# Patient Record
Sex: Male | Born: 1990 | Race: White | Hispanic: No | Marital: Single | Smoking: Never smoker
Health system: Southern US, Community
[De-identification: ages and names within clinical notes are randomized; demographics above are authoritative.]

---

## 2015-12-22 ENCOUNTER — Emergency Department (HOSPITAL_COMMUNITY)
Admission: EM | Admit: 2015-12-22 | Discharge: 2015-12-22 | Disposition: A | Discharge status: Home / Self Care | Payer: Medicaid Other | Attending: Emergency Medicine | Admitting: Emergency Medicine

## 2015-12-22 ENCOUNTER — Emergency Department (HOSPITAL_COMMUNITY): Payer: Medicaid Other

## 2015-12-22 ENCOUNTER — Encounter (HOSPITAL_COMMUNITY): Payer: Self-pay | Admitting: Emergency Medicine

## 2015-12-22 DIAGNOSIS — S62667B Nondisplaced fracture of distal phalanx of left little finger, initial encounter for open fracture: Secondary | ICD-10-CM | POA: Diagnosis not present

## 2015-12-22 DIAGNOSIS — Y92007 Garden or yard of unspecified non-institutional (private) residence as the place of occurrence of the external cause: Secondary | ICD-10-CM | POA: Insufficient documentation

## 2015-12-22 DIAGNOSIS — Y9389 Activity, other specified: Secondary | ICD-10-CM | POA: Diagnosis not present

## 2015-12-22 DIAGNOSIS — Y999 Unspecified external cause status: Secondary | ICD-10-CM | POA: Diagnosis not present

## 2015-12-22 DIAGNOSIS — W230XXA Caught, crushed, jammed, or pinched between moving objects, initial encounter: Secondary | ICD-10-CM | POA: Insufficient documentation

## 2015-12-22 DIAGNOSIS — Z23 Encounter for immunization: Secondary | ICD-10-CM | POA: Insufficient documentation

## 2015-12-22 DIAGNOSIS — S6992XA Unspecified injury of left wrist, hand and finger(s), initial encounter: Secondary | ICD-10-CM | POA: Diagnosis present

## 2015-12-22 MED ORDER — TETANUS-DIPHTH-ACELL PERTUSSIS 5-2.5-18.5 LF-MCG/0.5 IM SUSP
0.5000 mL | Freq: Once | INTRAMUSCULAR | Status: AC
  Administered 2015-12-22: 0.5 mL via INTRAMUSCULAR
  Filled 2015-12-22: qty 0.5

## 2015-12-22 MED ORDER — CEPHALEXIN 500 MG PO CAPS: 500.0000 mg | ORAL_CAPSULE | Freq: Four times a day (QID) | ORAL | 0 refills | Status: DC

## 2015-12-22 MED ORDER — CEFAZOLIN IN D5W 1 GM/50ML IV SOLN
1.0000 g | Freq: Once | INTRAVENOUS | Status: AC
  Administered 2015-12-22: 1 g via INTRAVENOUS
  Filled 2015-12-22 (×2): qty 50

## 2015-12-22 MED ORDER — ACETAMINOPHEN 325 MG PO TABS: 650.0000 mg | ORAL_TABLET | Freq: Four times a day (QID) | ORAL | 0 refills | Status: AC | PRN

## 2015-12-22 MED ORDER — OXYCODONE HCL 5 MG PO TABS: 5.0000 mg | ORAL_TABLET | ORAL | 0 refills | Status: AC | PRN

## 2015-12-22 MED ORDER — LIDOCAINE HCL (PF) 1 % IJ SOLN
30.0000 mL | Freq: Once | INTRAMUSCULAR | Status: AC
  Administered 2015-12-22: 30 mL
  Filled 2015-12-22: qty 30

## 2015-12-22 MED ORDER — IBUPROFEN 200 MG PO TABS: 600.0000 mg | ORAL_TABLET | Freq: Four times a day (QID) | ORAL | 0 refills | Status: AC | PRN

## 2015-12-22 NOTE — ED Notes (Signed)
Patient was alert, oriented and stable upon discharge. RN went over AVS and patient had no further questions.  

## 2015-12-22 NOTE — ED Notes (Signed)
Patient transported to X-ray 

## 2015-12-22 NOTE — ED Provider Notes (Signed)
[facility]-EMERGENCY DEPT Provider Note   CSN: [id number] Arrival date & time: 12/22/15  1930  By signing my name below, I, Teofilo Pod, attest that this documentation has been prepared under the direction and in the presence of Buel Ream, PA-C. Electronically Signed: Teofilo Pod, ED Scribe. 12/22/2015. 8:24 PM.   History   Chief Complaint Chief Complaint  Patient presents with  . Extremity Laceration    The history is provided by the patient. No language interpreter was used.   HPI Comments:  Hunter [place] is a 25 y.o. male who presents to the Emergency Department, here due to a laceration to his left small finger that he sustained 4 hours ago. Pt states that he was doing yard work and smashed his left small finger on the tow hook of his truck. Pt notes that he is unable to fully move the joint of the injured finger. Pt has used ice for the past 2 hours which has provided temporary relief. Pt denies any pain or injury/trauma elsewhere. Pt denies other associated symptoms.   History reviewed. No pertinent past medical history.  There are no active problems to display for this patient.   History reviewed. No pertinent surgical history.     Home Medications    Prior to Admission medications   Medication Sig Start Date End Date Taking? Authorizing Provider  acetaminophen (TYLENOL) 325 MG tablet Take 2 tablets (650 mg total) by mouth every 6 (six) hours as needed for mild pain or moderate pain. 12/22/15   Mack Hook, MD  cephALEXin (KEFLEX) 500 MG capsule Take 1 capsule (500 mg total) by mouth 4 (four) times daily. 12/22/15   Mack Hook, MD  ibuprofen (MOTRIN IB) 200 MG tablet Take 3 tablets (600 mg total) by mouth every 6 (six) hours as needed for mild pain or moderate pain. 12/22/15   Mack Hook, MD  oxyCODONE (ROXICODONE) 5 MG immediate release tablet Take 1 tablet (5 mg total) by mouth every 4 (four) hours as needed for severe pain or breakthrough  pain. 12/22/15   Mack Hook, MD    Family History No family history on file.  Social History Social History  Substance Use Topics  . Smoking status: Never Smoker  . Smokeless tobacco: Never Used  . Alcohol use No     Allergies   Benadryl [diphenhydramine hcl (sleep)] and Scallops [shellfish allergy]   Review of Systems Review of Systems  Constitutional: Negative for chills and fever.  HENT: Negative for facial swelling and sore throat.   Respiratory: Negative for shortness of breath.   Cardiovascular: Negative for chest pain.  Gastrointestinal: Negative for abdominal pain, nausea and vomiting.  Musculoskeletal: Positive for arthralgias. Negative for back pain.  Skin: Positive for wound. Negative for rash.  Psychiatric/Behavioral: The patient is not nervous/anxious.      Physical Exam Updated Vital Signs BP 133/84   Pulse 81   Temp 99.1 F (37.3 C)   Resp 16   Ht 5\' 10"  (1.778 m)   Wt (!) 141.6 kg   SpO2 98%   BMI 44.80 kg/m   Physical Exam  Constitutional: He appears well-developed and well-nourished. No distress.  HENT:  Head: Normocephalic and atraumatic.  Mouth/Throat: Oropharynx is clear and moist. No oropharyngeal exudate.  Eyes: Conjunctivae are normal. Pupils are equal, round, and reactive to light. Right eye exhibits no discharge. Left eye exhibits no discharge. No scleral icterus.  Neck: Normal range of motion. Neck supple. No thyromegaly present.  Cardiovascular: Normal rate,  regular rhythm, normal heart sounds and intact distal pulses.  Exam reveals no gallop and no friction rub.   No murmur heard. Pulmonary/Chest: Effort normal and breath sounds normal. No stridor. No respiratory distress. He has no wheezes. He has no rales.  Abdominal: Soft. Bowel sounds are normal. He exhibits no distension. There is no tenderness. There is no rebound and no guarding.  Musculoskeletal: He exhibits no edema.       Left hand: He exhibits tenderness, bony  tenderness and laceration. He exhibits normal capillary refill. Normal sensation noted. Normal strength noted.  L hand: Complete nailbed laceration to the left fifth digit; cap refill <2 secs; sensation intact; full range of motion at DIP and PIP; 5/5 strength; abduction and adduction intact; see image for more details  Lymphadenopathy:    He has no cervical adenopathy.  Neurological: He is alert. Coordination normal.  Skin: Skin is warm and dry. No rash noted. He is not diaphoretic. No pallor.  Psychiatric: He has a normal mood and affect.  Nursing note and vitals reviewed.          ED Treatments / Results  DIAGNOSTIC STUDIES:  Oxygen Saturation is 98% on RA, normal by my interpretation.    COORDINATION OF CARE:  8:24 PM Discussed treatment plan with pt at bedside and pt agreed to plan.   Labs (all labs ordered are listed, but only abnormal results are displayed) Labs Reviewed - No data to display  EKG  EKG Interpretation None       Radiology Dg Finger Little Left  Result Date: 12/22/2015 CLINICAL DATA:  Closed finger in door.  Laceration EXAM: LEFT LITTLE FINGER 2+V COMPARISON:  None. FINDINGS: Avulsion fracture the tuft of the distal fifth phalanx. Mild displacement. Associated soft tissue swelling and laceration. IMPRESSION: Fracture of the distal tuft of the fifth finger. Electronically Signed   By: Marlan Palauharles  Clark M.D.   On: 12/22/2015 21:01    Procedures Procedures (including critical care time)  Medications Ordered in ED Medications  Tdap (BOOSTRIX) injection 0.5 mL (0.5 mLs Intramuscular Given 12/22/15 2008)  ceFAZolin (ANCEF) IVPB 1 g/50 mL premix (0 g Intravenous Stopped 12/22/15 2211)  lidocaine (PF) (XYLOCAINE) 1 % injection 30 mL (30 mLs Infiltration Given by Other 12/22/15 2133)     Initial Impression / Assessment and Plan / ED Course  I have reviewed the triage vital signs and the nursing notes.  Pertinent labs & imaging results that were  available during my care of the patient were reviewed by me and considered in my medical decision making (see chart for details).  Clinical Course    Patient with complete nail bed laceration with fracture of the distal tuft of the fifth finger. I consulted Dr. Janee Mornhompson, hand surgeon, who evaluated the patient and repaired his injury in the ED. See Dr. Carollee Massedhompson's note for more details. Dr. Janee Mornhompson sent him home with Keflex, and analgesic. He'll arrange follow-up at his office. Patient understands and agrees with plan. Patient vitals stable throughout ED course and discharged in satisfactory condition.  Final Clinical Impressions(s) / ED Diagnoses   Final diagnoses:  Nondisplaced fracture of distal phalanx of left little finger, initial encounter for open fracture    New Prescriptions Discharge Medication List as of 12/22/2015 10:04 PM    START taking these medications   Details  acetaminophen (TYLENOL) 325 MG tablet Take 2 tablets (650 mg total) by mouth every 6 (six) hours as needed for mild pain or moderate pain., Starting Tue  12/22/2015, OTC    cephALEXin (KEFLEX) 500 MG capsule Take 1 capsule (500 mg total) by mouth 4 (four) times daily., Starting Tue 12/22/2015, Print    ibuprofen (MOTRIN IB) 200 MG tablet Take 3 tablets (600 mg total) by mouth every 6 (six) hours as needed for mild pain or moderate pain., Starting Tue 12/22/2015, OTC    oxyCODONE (ROXICODONE) 5 MG immediate release tablet Take 1 tablet (5 mg total) by mouth every 4 (four) hours as needed for severe pain or breakthrough pain., Starting Tue 12/22/2015, Print      I personally performed the services described in this documentation, which was scribed in my presence. The recorded information has been reviewed and is accurate.     Emi Holes, PA-C 12/23/15 1208    Maia Plan, MD 12/23/15 [phone]

## 2015-12-22 NOTE — ED Triage Notes (Signed)
Pt states that he hit his L little finger in a tailgate tonight and has a laceration across his nailbed. Alert and oriented.

## 2015-12-22 NOTE — Consult Note (Signed)
ORTHOPAEDIC CONSULTATION HISTORY & PHYSICAL REQUESTING PHYSICIAN: Hunter PlanJoshua G Long, MD  Chief Complaint: Left little finger injury  HPI: Hunter [place] is a 25 y.o. male who incompletely amputated the tip of the left small finger, loading a truck.  [place], so [facility] [place] [facility].  He has already received a tetanus update today, and IV antibiotics.  Injury happened around 4 PM.  History reviewed. No pertinent past medical history. History reviewed. No pertinent surgical history. Social History   Social History  . Marital status: Single    Spouse name: N/A  . Number of children: N/A  . Years of education: N/A   Social History Main Topics  . Smoking status: Never Smoker  . Smokeless tobacco: Never Used  . Alcohol use No  . Drug use: No  . Sexual activity: Not Asked   Other Topics Concern  . None   Social History Narrative  . None   No family history on file. Allergies  Allergen Reactions  . Benadryl [Diphenhydramine Hcl (Sleep)]     Hyper   . Scallops [Shellfish Allergy]    Prior to Admission medications   Medication Sig Start Date End Date Taking? Authorizing Provider  acetaminophen (TYLENOL) 325 MG tablet Take 2 tablets (650 mg total) by mouth every 6 (six) hours as needed for mild pain or moderate pain. 12/22/15   Mack Hookavid Heyward Douthit, MD  cephALEXin (KEFLEX) 500 MG capsule Take 1 capsule (500 mg total) by mouth 4 (four) times daily. 12/22/15   Mack Hookavid Davone Shinault, MD  ibuprofen (MOTRIN IB) 200 MG tablet Take 3 tablets (600 mg total) by mouth every 6 (six) hours as needed for mild pain or moderate pain. 12/22/15   Mack Hookavid Cashe Gatt, MD  oxyCODONE (ROXICODONE) 5 MG immediate release tablet Take 1 tablet (5 mg total) by mouth every 4 (four) hours as needed for severe pain or breakthrough pain. 12/22/15   Mack Hookavid Aune Adami, MD   Dg Finger Little Left  Result Date: 12/22/2015 CLINICAL DATA:   Closed finger in door.  Laceration EXAM: LEFT LITTLE FINGER 2+V COMPARISON:  None. FINDINGS: Avulsion fracture the tuft of the distal fifth phalanx. Mild displacement. Associated soft tissue swelling and laceration. IMPRESSION: Fracture of the distal tuft of the fifth finger. Electronically Signed   By: Marlan Palauharles  Clark M.D.   On: 12/22/2015 21:01    Positive ROS: All other systems have been reviewed and were otherwise negative with the exception of those mentioned in the HPI and as above.  Physical Exam: Vitals: Refer to EMR. Constitutional:  WD, WN, NAD HEENT:  NCAT, EOMI Neuro/Psych:  Alert & oriented to person, place, and time; appropriate mood & affect Lymphatic: No generalized extremity edema or lymphadenopathy Extremities / MSK:  The extremities are normal with respect to appearance, ranges of motion, joint stability, muscle strength/tone, sensation, & perfusion except as otherwise noted:  Left small finger incompletely amputated, with laceration through the nail plate 2 mm distal to the distal edge of the eponychial fold.  Laceration wraps around the ulnar side of the soft tissues towards the pulp, leaving a volar and radial intact bridge.  Flexion extensor tendons intact.  The flap appears viable.  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  Digital block was performed by me.  Turnicot was applied to the base of the digit and the wound copiously irrigated and clean under running water.  He was then prepped with Betadine and repair commenced, first the nail plate was removed.  It was into pieces, and both were removed.  Then, using a 21-gauge needle to reapproximate and pin the fracture fragments to the remainder of the phalanx.  With this in place, 5-0 chromic suture was used to reapproximate the  soft tissues ulnarly as well as perform a nailbed repair.  The needle was divided closer to the hub, and then such that it rested dorsally over the nail bed.  Xeroform was placed into the eponychial fold and on the nailbed under the pin.  Splint dressing was applied.  He tolerated this well. He'll be discharged with oral antibiotics, and analgesic planned, and my office will contact him within the next 1-2 days to arrange follow-up early next week.  He was instructed to keep the bandage on, clean and dry, until he returns.  Hunter Asters Janee Morn, MD      Orthopaedic & Hand Surgery [facility] [address] [place], [place]  [address] Office: [phone] Mobile: [phone]  12/22/2015, 9:54 PM

## 2015-12-22 NOTE — Discharge Instructions (Addendum)
Discharge Instructions ° ° °You have a dressing with a splint incorporated in it. °Move your fingers as much as possible, making a full fist and fully opening the fist. °Elevate your hand to reduce pain & swelling of the digits.  Ice over the operative site may be helpful to reduce pain & swelling.  DO NOT USE HEAT. °Pain medicine has been prescribed for you.  °Leave the dressing in place until you return to our office.  °You may shower, but keep the bandage clean & dry.  °You may drive a car when you are off of prescription pain medications and can safely control your vehicle with both hands. °Our office will call you to arrange follow-up ° ° °Please call [phone] during normal business hours or [phone] after hours for any problems. Including the following: ° °- excessive redness of the incisions °- drainage for more than 4 days °- fever of more than 101.5 F ° °*Please note that pain medications will not be refilled after hours or on weekends. ° ° ° ° °

## 2017-04-16 ENCOUNTER — Emergency Department (HOSPITAL_COMMUNITY)
Admission: EM | Admit: 2017-04-16 | Discharge: 2017-04-16 | Disposition: A | Discharge status: Home / Self Care | Payer: Medicaid Other | Attending: Emergency Medicine | Admitting: Emergency Medicine

## 2017-04-16 ENCOUNTER — Other Ambulatory Visit: Payer: Self-pay

## 2017-04-16 ENCOUNTER — Emergency Department (HOSPITAL_COMMUNITY): Payer: Medicaid Other

## 2017-04-16 ENCOUNTER — Encounter (HOSPITAL_COMMUNITY): Payer: Self-pay | Admitting: Emergency Medicine

## 2017-04-16 DIAGNOSIS — Y939 Activity, unspecified: Secondary | ICD-10-CM | POA: Diagnosis not present

## 2017-04-16 DIAGNOSIS — Y929 Unspecified place or not applicable: Secondary | ICD-10-CM | POA: Diagnosis not present

## 2017-04-16 DIAGNOSIS — Y999 Unspecified external cause status: Secondary | ICD-10-CM | POA: Insufficient documentation

## 2017-04-16 DIAGNOSIS — W01198A Fall on same level from slipping, tripping and stumbling with subsequent striking against other object, initial encounter: Secondary | ICD-10-CM | POA: Insufficient documentation

## 2017-04-16 DIAGNOSIS — S81811A Laceration without foreign body, right lower leg, initial encounter: Secondary | ICD-10-CM | POA: Diagnosis not present

## 2017-04-16 DIAGNOSIS — F840 Autistic disorder: Secondary | ICD-10-CM | POA: Insufficient documentation

## 2017-04-16 DIAGNOSIS — S8991XA Unspecified injury of right lower leg, initial encounter: Secondary | ICD-10-CM | POA: Diagnosis present

## 2017-04-16 MED ORDER — SULFAMETHOXAZOLE-TRIMETHOPRIM 800-160 MG PO TABS: 1.0000 | ORAL_TABLET | Freq: Two times a day (BID) | ORAL | 0 refills | Status: AC

## 2017-04-16 MED ORDER — CEPHALEXIN 500 MG PO CAPS: 500.0000 mg | ORAL_CAPSULE | Freq: Four times a day (QID) | ORAL | 0 refills | Status: AC

## 2017-04-16 MED ORDER — SULFAMETHOXAZOLE-TRIMETHOPRIM 800-160 MG PO TABS
1.0000 | ORAL_TABLET | Freq: Once | ORAL | Status: AC
  Administered 2017-04-16: 1 via ORAL
  Filled 2017-04-16: qty 1

## 2017-04-16 MED ORDER — CEPHALEXIN 500 MG PO CAPS
500.0000 mg | ORAL_CAPSULE | Freq: Once | ORAL | Status: AC
  Administered 2017-04-16: 500 mg via ORAL
  Filled 2017-04-16: qty 1

## 2017-04-16 MED ORDER — IBUPROFEN 200 MG PO TABS
600.0000 mg | ORAL_TABLET | Freq: Once | ORAL | Status: AC
  Administered 2017-04-16: 600 mg via ORAL
  Filled 2017-04-16: qty 3

## 2017-04-16 NOTE — ED Triage Notes (Addendum)
Pt reports fishing this morning and fell on a rock. Result is a an open laceration on anterior RLE. Unwrapped and visualized, pain is 10/10 and bleeding is moderately controlled. Rewrapped to control bleeding with telfa, sterile gauze and wrapped.

## 2017-04-16 NOTE — Discharge Instructions (Signed)
You may remove the bandage after 24 hours. Clean the wound and surrounding area gently with tap water and mild soap. Rinse well and blot dry. Do not scrub the wound, as this may cause the wound edges to come apart. You may shower, but avoid submerging the wound, such as with a bath or swimming. Clean the wound daily to prevent infection. Do not use cleaners such as hydrogen peroxide or alcohol.   Please take all of your antibiotics until finished!   You may develop abdominal discomfort or diarrhea from the antibiotic.  You may help offset this with probiotics which you can buy or get in yogurt. Do not eat or take the probiotics until 2 hours after your antibiotic.   Scar reduction: Application of a topical antibiotic ointment, such as Neosporin, after the wound has begun to close and heal well can decrease scab formation and reduce scarring. After the wound has healed and wound closures have been removed, application of ointments such as Aquaphor can also reduce scar formation.  The key to scar reduction is keeping the skin well hydrated and supple. Drinking plenty of water throughout the day (At least eight 8oz glasses of water a day) is essential to staying well hydrated.  Sun exposure: Keep the wound out of the sun. After the wound has healed, continue to protect it from the sun by wearing protective clothing or applying sunscreen.  Pain: You may use Tylenol, naproxen, or ibuprofen for pain.  Suture/staple removal: Return to the ED in 8-12 days for suture removal.  Wound check: Return to the ED or go to a primary care provider in about 3 days for wound check.  Return to the ED sooner should the wound edges come apart or signs of infection arise, such as spreading redness, puffiness/swelling, pus draining from the wound, severe increase in pain, fever over 100.46F, or any other major issues.

## 2017-04-16 NOTE — ED Provider Notes (Signed)
[facility]-EMERGENCY DEPT Provider Note   CSN: [id number] Arrival date & time: 04/16/17  1653     History   Chief Complaint Chief Complaint  Patient presents with  . Laceration    HPI Hunter Thomas is a 27 y.o. male.  HPI   Hunter [place] is a 27 y.o. male, with a history of mild cognitive dysfunction and autism, presenting to the ED with laceration to the right lower leg that occurred around 1 PM today.  Accompanied by his mother at the bedside.  Patient [place], slipped, and struck the front of his leg on some rocks.  The wound did not enter the water and he washed the wound in the shower prior to arrival.  Pain is throbbing, severe, nonradiating.  Has not taken any medications prior to arrival.  Tetanus up-to-date.  Denies numbness, weakness, head injury, neck/back pain, or any other complaints.     History reviewed. No pertinent past medical history.  There are no active problems to display for this patient.   History reviewed. No pertinent surgical history.     Home Medications    Prior to Admission medications   Medication Sig Start Date End Date Taking? Authorizing Provider  acetaminophen (TYLENOL) 325 MG tablet Take 2 tablets (650 mg total) by mouth every 6 (six) hours as needed for mild pain or moderate pain. 12/22/15   Mack Hookhompson, David, MD  cephALEXin (KEFLEX) 500 MG capsule Take 1 capsule (500 mg total) by mouth 4 (four) times daily. 04/16/17   Rayce Brahmbhatt C, PA-C  ibuprofen (MOTRIN IB) 200 MG tablet Take 3 tablets (600 mg total) by mouth every 6 (six) hours as needed for mild pain or moderate pain. 12/22/15   Mack Hookhompson, David, MD  oxyCODONE (ROXICODONE) 5 MG immediate release tablet Take 1 tablet (5 mg total) by mouth every 4 (four) hours as needed for severe pain or breakthrough pain. 12/22/15   Mack Hookhompson, David, MD  sulfamethoxazole-trimethoprim (BACTRIM DS,SEPTRA DS) 800-160 MG tablet Take 1 tablet by  mouth 2 (two) times daily for 10 days. 04/16/17 04/26/17  Anselm PancoastJoy, Finis Hendricksen C, PA-C    Family History History reviewed. No pertinent family history.  Social History Social History   Tobacco Use  . Smoking status: Never Smoker  . Smokeless tobacco: Never Used  Substance Use Topics  . Alcohol use: No  . Drug use: No     Allergies   Benadryl [diphenhydramine hcl (sleep)] and Scallops [shellfish allergy]   Review of Systems Review of Systems  Musculoskeletal: Negative for back pain and neck pain.  Skin: Positive for wound.  Neurological: Negative for weakness and numbness.     Physical Exam Updated Vital Signs BP (!) 150/81   Pulse (!) 125   Temp 98.2 F (36.8 C)   Resp 20   SpO2 100%   Physical Exam  Constitutional: He appears well-developed and well-nourished. No distress.  HENT:  Head: Normocephalic and atraumatic.  Eyes: Conjunctivae are normal.  Neck: Neck supple.  Cardiovascular: Normal rate, regular rhythm and intact distal pulses.  Despite the patient's listed initial pulse rate, he was not tachycardic upon my exam.  Pulmonary/Chest: Effort normal.  Musculoskeletal: He exhibits tenderness.  3 cm curvilinear laceration to the right anterior lower leg.  Hemorrhage largely controlled.  Surrounding tenderness, but no noted crepitus, instability, or deformity. Full range of motion in the right knee and ankle.  Neurological: He is alert.  No noted acute sensory deficits.  Strength 5/5 with flexion and extension at the bilateral hips, knees, and ankles. No noted gait deficit. Coordination intact with heel to shin testing.  Skin: Skin is warm and dry. Capillary refill takes less than 2 seconds. He is not diaphoretic. No pallor.  Psychiatric: He has a normal mood and affect. His behavior is normal.  Nursing note and vitals reviewed.            ED Treatments / Results  Labs (all labs ordered are listed, but only abnormal results are displayed) Labs Reviewed -  No data to display  EKG  EKG Interpretation None       Radiology Dg Tibia/fibula Right  Result Date: 04/16/2017 CLINICAL DATA:  Larey Seat on large sharp rock today around 1400hrs. Pain and laceration to mid shaft of right lower leg. No trouble weight bearing EXAM: RIGHT TIBIA AND FIBULA - 2 VIEW COMPARISON:  None. FINDINGS: There is no evidence of fracture or other focal bone lesions. Soft tissues are unremarkable. IMPRESSION: Negative. Electronically Signed   By: Norva Pavlov M.D.   On: 04/16/2017 18:52    Procedures .Marland KitchenLaceration Repair Date/Time: 04/16/2017 6:04 PM Performed by: Anselm Pancoast, PA-C Authorized by: Anselm Pancoast, PA-C   Consent:    Consent obtained:  Verbal   Consent given by:  Patient   Risks discussed:  Infection, need for additional repair, pain, poor cosmetic result, poor wound healing and retained foreign body Anesthesia (see MAR for exact dosages):    Anesthesia method:  Local infiltration   Local anesthetic:  Lidocaine 2% WITH epi and bupivacaine 0.5% w/o epi Laceration details:    Location:  Leg   Leg location:  R lower leg   Length (cm):  3 Repair type:    Repair type:  Intermediate Pre-procedure details:    Preparation:  Patient was prepped and draped in usual sterile fashion and imaging obtained to evaluate for foreign bodies Exploration:    Hemostasis achieved with:  Epinephrine   Wound exploration: wound explored through full range of motion   Treatment:    Area cleansed with:  Betadine and saline   Amount of cleaning:  Extensive   Irrigation solution:  Sterile saline   Irrigation volume:  600cc   Irrigation method:  Syringe Subcutaneous repair:    Suture size:  4-0   Suture material:  Vicryl   Suture technique:  Figure eight   Number of sutures:  4 Skin repair:    Repair method:  Sutures   Suture size:  3-0   Suture material:  Prolene   Suture technique:  Horizontal mattress   Number of sutures:  4 Approximation:    Approximation:   Close Post-procedure details:    Dressing:  Non-adherent dressing and sterile dressing   Patient tolerance of procedure:  Tolerated well, no immediate complications     (including critical care time)  Medications Ordered in ED Medications  ibuprofen (ADVIL,MOTRIN) tablet 600 mg (600 mg Oral Given 04/16/17 1950)  cephALEXin (KEFLEX) capsule 500 mg (500 mg Oral Given 04/16/17 1951)  sulfamethoxazole-trimethoprim (BACTRIM DS,SEPTRA DS) 800-160 MG per tablet 1 tablet (1 tablet Oral Given 04/16/17 1950)     Initial Impression / Assessment and Plan / ED Course  I have reviewed the triage vital signs and the nursing notes.  Pertinent labs & imaging results that were available during my care of the patient were reviewed by me and considered in my medical decision making (see chart for details).     Patient  presents with a laceration to the right lower leg.  No noted retained foreign bodies or underlying osseous injury on x-ray.  No noted neuro deficits.  Laceration repaired without immediate complication.  Antibiotic coverage extended to include MRSA coverage as well as coverage due to possible [place] environmental exposure. PCP follow up for wound check in 2 days (mother states patient has access to a PCP). The patient was given instructions for home care as well as return precautions. Patient voices understanding of these instructions, accepts the plan, and is comfortable with discharge.     Final Clinical Impressions(s) / ED Diagnoses   Final diagnoses:  Laceration of right lower extremity, initial encounter    ED Discharge Orders        Ordered    cephALEXin (KEFLEX) 500 MG capsule  4 times daily     04/16/17 1954    sulfamethoxazole-trimethoprim (BACTRIM DS,SEPTRA DS) 800-160 MG tablet  2 times daily     04/16/17 1954       Concepcion Living 04/16/17 2325    Mancel Bale, MD 04/16/17 [phone]

## 2017-04-20 ENCOUNTER — Emergency Department (HOSPITAL_COMMUNITY)
Admission: EM | Admit: 2017-04-20 | Discharge: 2017-04-20 | Disposition: A | Discharge status: Home / Self Care | Payer: Medicaid Other | Attending: Emergency Medicine | Admitting: Emergency Medicine

## 2017-04-20 ENCOUNTER — Encounter (HOSPITAL_COMMUNITY): Payer: Self-pay | Admitting: Emergency Medicine

## 2017-04-20 DIAGNOSIS — Z5189 Encounter for other specified aftercare: Secondary | ICD-10-CM

## 2017-04-20 DIAGNOSIS — S81811D Laceration without foreign body, right lower leg, subsequent encounter: Secondary | ICD-10-CM | POA: Diagnosis present

## 2017-04-20 DIAGNOSIS — W01198A Fall on same level from slipping, tripping and stumbling with subsequent striking against other object, initial encounter: Secondary | ICD-10-CM | POA: Insufficient documentation

## 2017-04-20 NOTE — ED Triage Notes (Signed)
Patient reports he is here for check up for recent stitches on his right shin. Pt denies any pain or signs of infection.

## 2017-04-20 NOTE — Discharge Instructions (Signed)
Continue taking the antibiotics you were prescribed at your last visit. Keep wound clean with mild soap and water. Keep area covered with a topical antibiotic ointment and bandage and keep bandage dry. Ice and elevate for additional pain and swelling relief. Alternate between Ibuprofen and Tylenol for additional pain relief. Follow up with your primary care doctor or [facility] in approximately 5-7 days for wound recheck and suture removal. Monitor area for signs of infection to include, but not limited to: increasing pain, spreading redness, drainage/pus, worsening swelling, or fevers. Return to emergency department for emergent changing or worsening symptoms.

## 2017-04-20 NOTE — ED Provider Notes (Signed)
[facility]-EMERGENCY DEPT Provider Note   CSN: [id number] Arrival date & time: 04/20/17  1649     History   Chief Complaint Chief Complaint  Patient presents with  . Wound Check    HPI Hunter Thomas is a 27 y.o. male with a PMHx of autism, who presents to the ED for wound check.  Per chart review, pt sustained a RLE laceration on 04/16/17 after slipping on rocks and cutting his leg on the rocks.  The wound was closed using 4-0 prolene utilizing 4 simple interrupted sutures on the skin layer (some vicryl sutures used for subcutaneous repair) and he was discharged home on keflex and bactrim and advised to return in 2 days for wound recheck.  Patient states that he has been feeling well and the wound has been doing well since the stitches were placed on 04/16/17.  He denies any drainage, erythema, red streaking, warmth, swelling, or ongoing pain.  He denies any fevers or any other complaints at this time.  He has been compliant with the antibiotics that were prescribed to him.  He has no complaints or concerns at this time.  His TDap is UTD.  Xray on the date of initial visit was negative.    The history is provided by the patient and medical records. No language interpreter was used.  Wound Check  This is a new problem. The current episode started more than 2 days ago. The problem occurs rarely. The problem has been rapidly improving. Nothing aggravates the symptoms. Nothing relieves the symptoms. Treatments tried: antibiotics. The treatment provided significant relief.    History reviewed. No pertinent past medical history.  There are no active problems to display for this patient.   History reviewed. No pertinent surgical history.     Home Medications    Prior to Admission medications   Medication Sig Start Date End Date Taking? Authorizing Provider  acetaminophen (TYLENOL) 325 MG tablet Take 2 tablets (650 mg total) by mouth every 6 (six) hours as needed for  mild pain or moderate pain. 12/22/15   Mack Hook, MD  cephALEXin (KEFLEX) 500 MG capsule Take 1 capsule (500 mg total) by mouth 4 (four) times daily. 04/16/17   Joy, Shawn C, PA-C  ibuprofen (MOTRIN IB) 200 MG tablet Take 3 tablets (600 mg total) by mouth every 6 (six) hours as needed for mild pain or moderate pain. 12/22/15   Mack Hook, MD  oxyCODONE (ROXICODONE) 5 MG immediate release tablet Take 1 tablet (5 mg total) by mouth every 4 (four) hours as needed for severe pain or breakthrough pain. 12/22/15   Mack Hook, MD  sulfamethoxazole-trimethoprim (BACTRIM DS,SEPTRA DS) 800-160 MG tablet Take 1 tablet by mouth 2 (two) times daily for 10 days. 04/16/17 04/26/17  Anselm Pancoast, PA-C    Family History No family history on file.  Social History Social History   Tobacco Use  . Smoking status: Never Smoker  . Smokeless tobacco: Never Used  Substance Use Topics  . Alcohol use: No  . Drug use: No     Allergies   Benadryl [diphenhydramine hcl (sleep)] and Scallops [shellfish allergy]   Review of Systems Review of Systems  Constitutional: Negative for chills and fever.  Musculoskeletal: Negative for arthralgias, joint swelling and myalgias.  Skin: Positive for wound. Negative for color change.  Allergic/Immunologic: Negative for immunocompromised state.     Physical Exam Updated Vital Signs BP (!) 121/93 (BP Location: Left Arm)   Pulse 79  Temp 98.7 F (37.1 C) (Oral)   SpO2 97%   Physical Exam  Constitutional: He is oriented to person, place, and time. Vital signs are normal. He appears well-developed and well-nourished.  Non-toxic appearance. No distress.  Afebrile, nontoxic, NAD  HENT:  Head: Normocephalic and atraumatic.  Mouth/Throat: Mucous membranes are normal.  Eyes: Conjunctivae and EOM are normal. Right eye exhibits no discharge. Left eye exhibits no discharge.  Neck: Normal range of motion. Neck supple.  Cardiovascular: Normal rate and intact  distal pulses.  Pulmonary/Chest: Effort normal. No respiratory distress.  Abdominal: Normal appearance. He exhibits no distension.  Musculoskeletal: Normal range of motion.  Neurological: He is alert and oriented to person, place, and time. He has normal strength. No sensory deficit.  Skin: Skin is warm and dry. Abrasion and laceration (sutured) noted. No rash noted.  R shin sutured wound with surrounding abrasion and expected mild erythema on the abrasions, but no spreading erythema/red streaking around the wound, no warmth, no fluctuance or induration, nonTTP, no swelling, no crepitus or deformity, wound without dehiscence or separation, dried crusted blood over the sutures but no drainage noted. NVI with soft compartments.   Psychiatric: He has a normal mood and affect.  Nursing note and vitals reviewed.    ED Treatments / Results  Labs (all labs ordered are listed, but only abnormal results are displayed) Labs Reviewed - No data to display  EKG  EKG Interpretation None       Radiology No results found.  No results found for this or any previous visit. Dg Tibia/fibula Right  Result Date: 04/16/2017 CLINICAL DATA:  Larey SeatFell on large sharp rock today around 1400hrs. Pain and laceration to mid shaft of right lower leg. No trouble weight bearing EXAM: RIGHT TIBIA AND FIBULA - 2 VIEW COMPARISON:  None. FINDINGS: There is no evidence of fracture or other focal bone lesions. Soft tissues are unremarkable. IMPRESSION: Negative. Electronically Signed   By: Norva PavlovElizabeth  Brown M.D.   On: 04/16/2017 18:52     Procedures Procedures (including critical care time)  Medications Ordered in ED Medications - No data to display   Initial Impression / Assessment and Plan / ED Course  I have reviewed the triage vital signs and the nursing notes.  Pertinent labs & imaging results that were available during my care of the patient were reviewed by me and considered in my medical decision making (see  chart for details).     27 y.o. male here for wound check after sutures placed on 04/16/17 on R shin. Pt has no complaints, states the area is healing well, he's been compliant with abx, and he has no s/sx of infection. On exam, 4 prolene sutures in place with dried crusted blood overlying the sutures and wound, wound edges intact without dehiscence, small abrasion around wound which is slightly erythematous as expected but no warm to touch and without red streaking, no drainage, no fluctuance or induration, nonTTP, NVI with soft compartments. No signs of cellulitis or infection. Advised continuation of abx, RICE, tylenol/motrin for pain, and f/up with PCP/[facility] in 5-7 days for suture removal and wound recheck. I explained the diagnosis and have given explicit precautions to return to the ER including for any other new or worsening symptoms. The patient understands and accepts the medical plan as it's been dictated and I have answered their questions. Discharge instructions concerning home care and prescriptions have been given. The patient is STABLE and is discharged to home in good condition.  Final Clinical Impressions(s) / ED Diagnoses   Final diagnoses:  Visit for wound check    ED Discharge Orders    [address] [place], [place] 04/20/17 1842    Vanetta Mulders, MD 04/20/17 [phone]

## 2017-04-26 ENCOUNTER — Ambulatory Visit (HOSPITAL_COMMUNITY)
Admission: EM | Admit: 2017-04-26 | Discharge: 2017-04-26 | Disposition: A | Discharge status: Home / Self Care | Payer: Medicaid Other

## 2017-04-26 DIAGNOSIS — Z4802 Encounter for removal of sutures: Secondary | ICD-10-CM

## 2017-04-26 NOTE — ED Notes (Signed)
Pt here for removal of 4 sutures in R lower leg. Pt initial injury was 10 days ago. Wound is healing well, no redness or swelling or drainage noted. Large scab around area of laceration. No bleeding. Pt had no further questions, denies pain. ambulated out of UC.

## 2019-02-08 IMAGING — CR DG TIBIA/FIBULA 2V*R*
3 series · 3 of 3 positions shown · non-contrast
Comparison: None.

CLINICAL DATA: Fell on large sharp rock today around 3955hrs. Pain
and laceration to mid shaft of right lower leg. No trouble weight
bearing

EXAM:
RIGHT TIBIA AND FIBULA - 2 VIEW

[x tib-fib ap right]
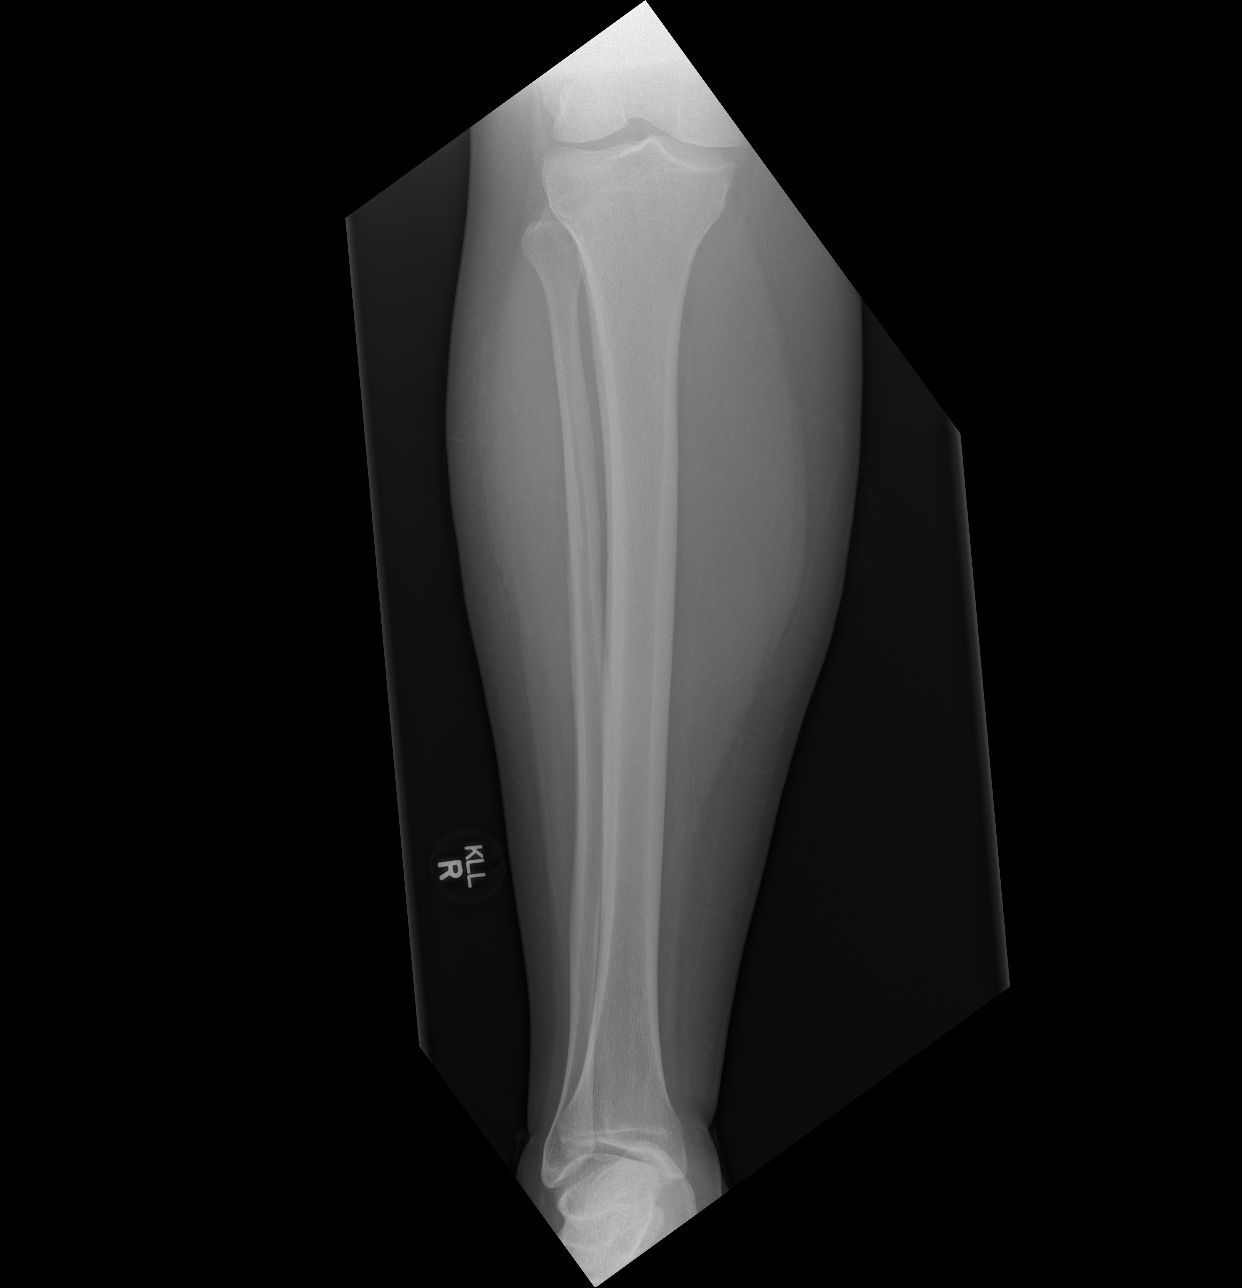

[x tib-fib lat right (1 of 2)]
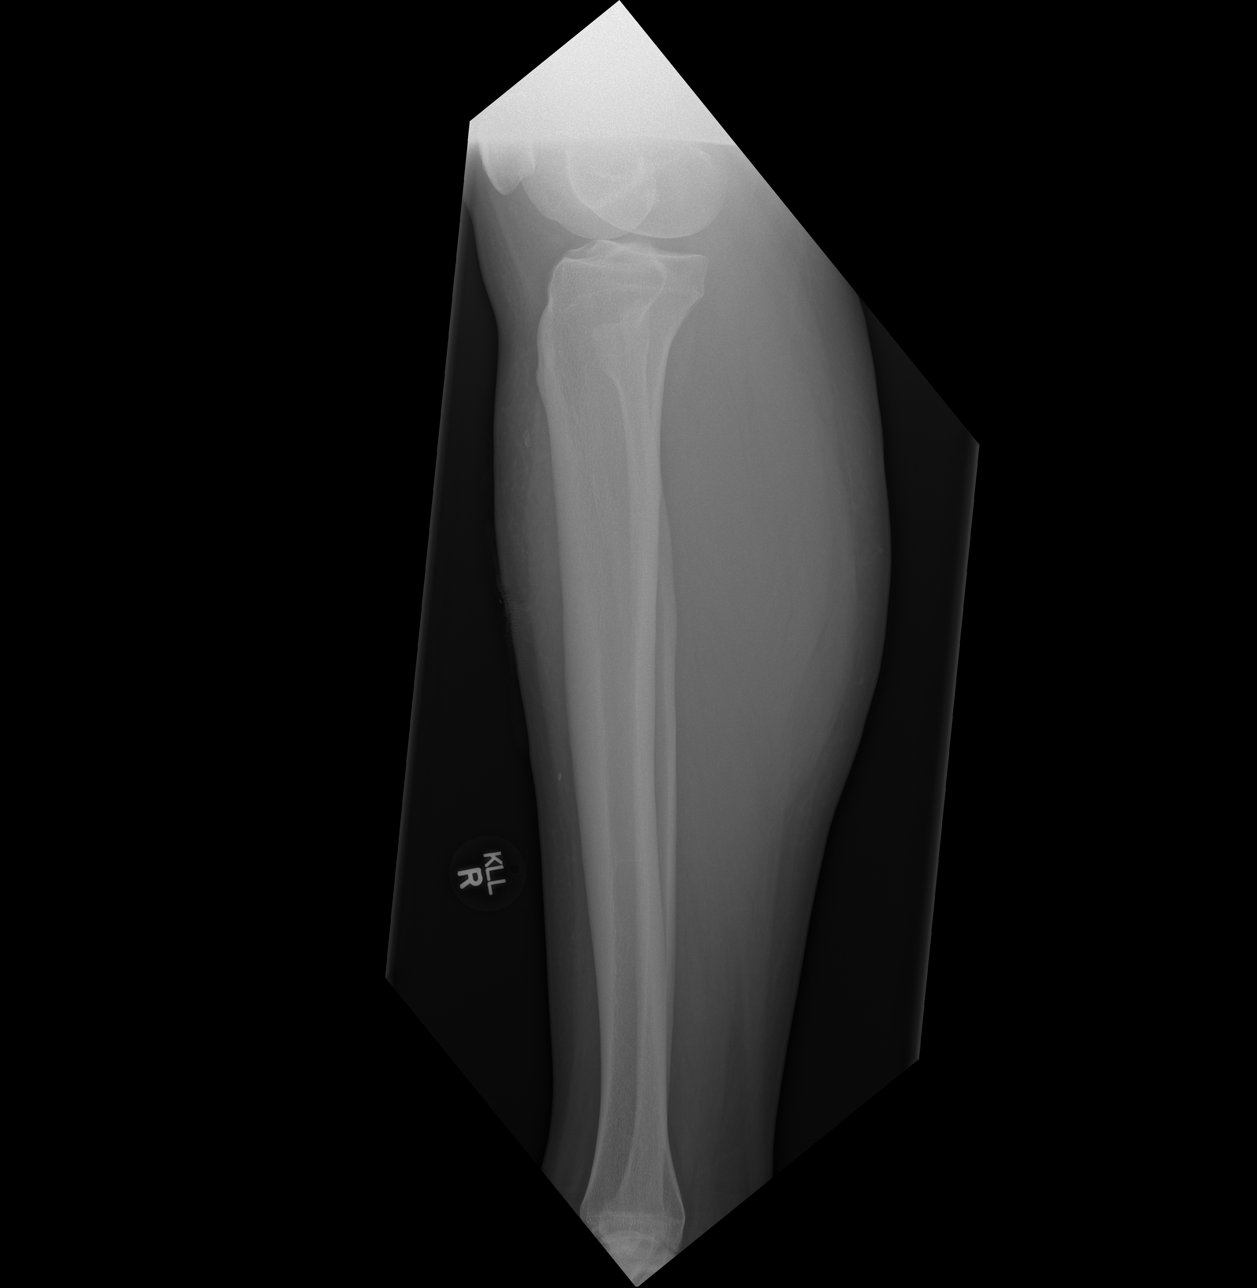

[x tib-fib lat right (2 of 2)]
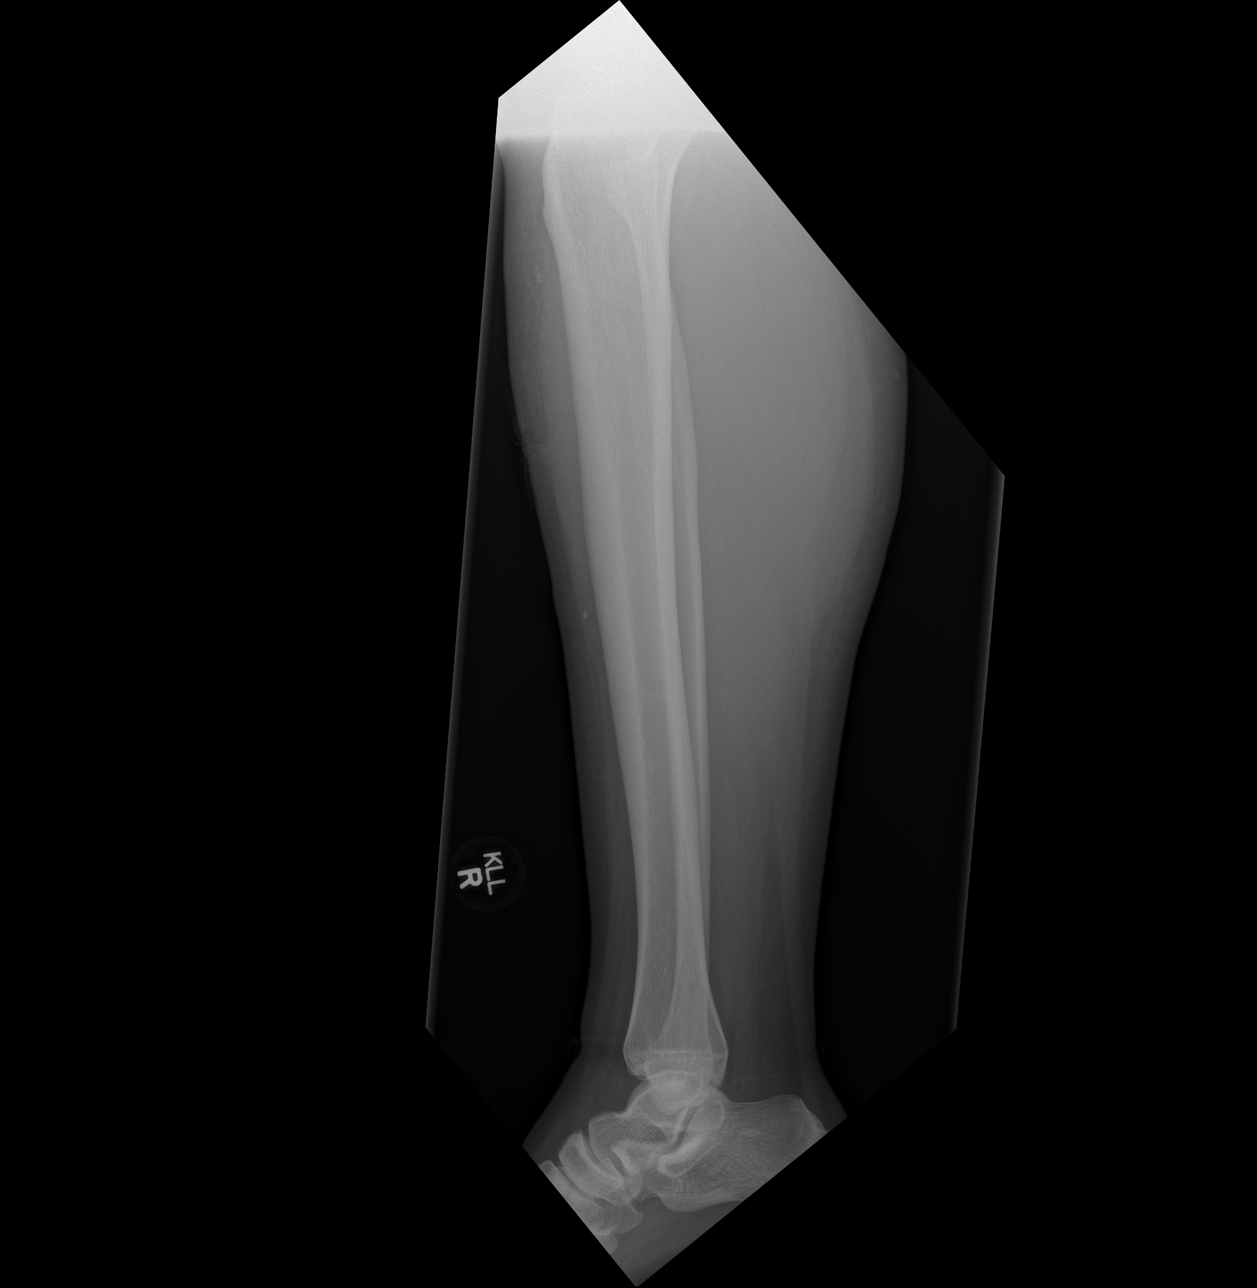

[3 of 3 positions shown; findings below may reference images not displayed]

FINDINGS: There is no evidence of fracture or other focal bone lesions. Soft
tissues are unremarkable.
IMPRESSION: Negative.
